# Patient Record
Sex: Male | Born: 1960 | Hispanic: No | Marital: Single | Smoking: Never smoker
Health system: Southern US, Community
[De-identification: ages and names within clinical notes are randomized; demographics above are authoritative.]

---

## 1998-03-27 ENCOUNTER — Emergency Department (HOSPITAL_COMMUNITY): Admission: EM | Admit: 1998-03-27 | Discharge: 1998-03-27 | Payer: Self-pay | Admitting: Emergency Medicine

## 1998-03-27 ENCOUNTER — Encounter: Payer: Self-pay | Admitting: Emergency Medicine

## 1998-03-29 ENCOUNTER — Emergency Department (HOSPITAL_COMMUNITY): Admission: EM | Admit: 1998-03-29 | Discharge: 1998-03-29 | Payer: Self-pay | Admitting: Emergency Medicine

## 1998-04-19 ENCOUNTER — Ambulatory Visit: Admission: RE | Admit: 1998-04-19 | Discharge: 1998-04-19 | Payer: Self-pay | Admitting: Family Medicine

## 1998-04-19 ENCOUNTER — Encounter: Payer: Self-pay | Admitting: Family Medicine

## 1999-02-05 ENCOUNTER — Emergency Department (HOSPITAL_COMMUNITY): Admission: EM | Admit: 1999-02-05 | Discharge: 1999-02-05 | Payer: Self-pay | Admitting: Emergency Medicine

## 1999-02-05 ENCOUNTER — Encounter: Payer: Self-pay | Admitting: Emergency Medicine

## 1999-02-12 ENCOUNTER — Emergency Department (HOSPITAL_COMMUNITY): Admission: EM | Admit: 1999-02-12 | Discharge: 1999-02-12 | Payer: Self-pay | Admitting: Emergency Medicine

## 2001-08-14 ENCOUNTER — Emergency Department (HOSPITAL_COMMUNITY): Admission: EM | Admit: 2001-08-14 | Discharge: 2001-08-14 | Payer: Self-pay | Admitting: Emergency Medicine

## 2001-08-14 ENCOUNTER — Encounter: Payer: Self-pay | Admitting: Emergency Medicine

## 2001-09-18 ENCOUNTER — Emergency Department (HOSPITAL_COMMUNITY): Admission: EM | Admit: 2001-09-18 | Discharge: 2001-09-18 | Payer: Self-pay | Admitting: Emergency Medicine

## 2001-09-18 ENCOUNTER — Encounter: Payer: Self-pay | Admitting: Emergency Medicine

## 2010-01-05 ENCOUNTER — Emergency Department (HOSPITAL_COMMUNITY): Admission: EM | Admit: 2010-01-05 | Discharge: 2010-01-05 | Payer: Self-pay | Admitting: Emergency Medicine

## 2016-05-24 LAB — GLUCOSE, POCT (MANUAL RESULT ENTRY): POC GLUCOSE: 111 mg/dL — AB (ref 70–99)

## 2017-07-01 ENCOUNTER — Ambulatory Visit: Payer: Medicaid Other | Admitting: Podiatry

## 2017-07-08 ENCOUNTER — Encounter: Payer: Self-pay | Admitting: Podiatry

## 2017-07-08 ENCOUNTER — Ambulatory Visit (INDEPENDENT_AMBULATORY_CARE_PROVIDER_SITE_OTHER): Payer: Medicaid Other | Admitting: Podiatry

## 2017-07-08 VITALS — BP 127/83 | HR 71 | Ht 68.0 in | Wt 210.0 lb

## 2017-07-08 DIAGNOSIS — B351 Tinea unguium: Secondary | ICD-10-CM | POA: Diagnosis not present

## 2017-07-08 DIAGNOSIS — B353 Tinea pedis: Secondary | ICD-10-CM

## 2017-07-08 DIAGNOSIS — Z79899 Other long term (current) drug therapy: Secondary | ICD-10-CM

## 2017-07-08 LAB — HEPATIC FUNCTION PANEL
AG RATIO: 1.5 (calc) (ref 1.0–2.5)
ALKALINE PHOSPHATASE (APISO): 73 U/L (ref 40–115)
ALT: 16 U/L (ref 9–46)
AST: 15 U/L (ref 10–35)
Albumin: 4.3 g/dL (ref 3.6–5.1)
BILIRUBIN DIRECT: 0.1 mg/dL (ref 0.0–0.2)
BILIRUBIN INDIRECT: 0.7 mg/dL (ref 0.2–1.2)
BILIRUBIN TOTAL: 0.8 mg/dL (ref 0.2–1.2)
Globulin: 2.8 g/dL (calc) (ref 1.9–3.7)
TOTAL PROTEIN: 7.1 g/dL (ref 6.1–8.1)

## 2017-07-08 LAB — CBC WITH DIFFERENTIAL/PLATELET
BASOS ABS: 9 {cells}/uL (ref 0–200)
Basophils Relative: 0.2 %
EOS PCT: 0.5 %
Eosinophils Absolute: 22 cells/uL (ref 15–500)
HCT: 41.1 % (ref 38.5–50.0)
HEMOGLOBIN: 14 g/dL (ref 13.2–17.1)
LYMPHS ABS: 1832 {cells}/uL (ref 850–3900)
MCH: 28.7 pg (ref 27.0–33.0)
MCHC: 34.1 g/dL (ref 32.0–36.0)
MCV: 84.4 fL (ref 80.0–100.0)
MPV: 10.5 fL (ref 7.5–12.5)
Monocytes Relative: 8.6 %
Neutro Abs: 2068 cells/uL (ref 1500–7800)
Neutrophils Relative %: 48.1 %
Platelets: 291 10*3/uL (ref 140–400)
RBC: 4.87 10*6/uL (ref 4.20–5.80)
RDW: 12.7 % (ref 11.0–15.0)
Total Lymphocyte: 42.6 %
WBC mixed population: 370 cells/uL (ref 200–950)
WBC: 4.3 10*3/uL (ref 3.8–10.8)

## 2017-07-08 MED ORDER — TERBINAFINE HCL 250 MG PO TABS: 250.0000 mg | ORAL_TABLET | Freq: Every day | ORAL | 0 refills | Status: DC

## 2017-07-08 NOTE — Progress Notes (Signed)
Subjective:    Patient ID: Jeffrey Velez, male    DOB: [birth year], 57 y.o.   MRN: [id number]  HPI  Chief Complaint  Patient presents with  . Tinea Pedis    bilateral/between toes painful, sometimes bleeds. Also complains of "smelly" feet and dry skin  57 year old male presents the office today for concerns of thick, discolored toenails as well as cracked skin in between her toes.  She also states that she has a smell to her feet at times.  She said no recent treatment for this.  He denies any open sores or skin fissures.  Denies any swelling.  She has no other concerns today.  Review of Systems  All other systems reviewed and are negative.  History reviewed. No pertinent past medical history.  History reviewed. No pertinent surgical history.   Current Outpatient Medications:  .  terbinafine (LAMISIL) 250 MG tablet, Take 1 tablet (250 mg total) by mouth daily., Disp: 90 tablet, Rfl: 0  No Known Allergies  Social History   Socioeconomic History  . Marital status: Single    Spouse name: Not on file  . Number of children: Not on file  . Years of education: Not on file  . Highest education level: Not on file  Social Needs  . Financial resource strain: Not on file  . Food insecurity - worry: Not on file  . Food insecurity - inability: Not on file  . Transportation needs - medical: Not on file  . Transportation needs - non-medical: Not on file  Occupational History  . Not on file  Tobacco Use  . Smoking status: Not on file  Substance and Sexual Activity  . Alcohol use: Not on file  . Drug use: Not on file  . Sexual activity: Not on file  Other Topics Concern  . Not on file  Social History Narrative  . Not on file       Objective:   Physical Exam  General: AAO x3, NAD  Dermatological: Nails appear to be hypertrophic, dystrophic with yellow-brown discoloration.  There is no surrounding redness or drainage there is no signs of infection.  No tenderness to  palpation of the nails.  Mild tinea pedis is present interdigitally as well as the plantar aspect the foot there is no skin fissures or drainage or open sores.  Vascular: Dorsalis Pedis artery and Posterior Tibial artery pedal pulses are 2/4 bilateral with immedate capillary fill time.  There is no pain with calf compression, swelling, warmth, erythema.   Neruologic: Grossly intact via light touch bilateral.  Protective threshold with Semmes Wienstein monofilament intact to all pedal sites bilateral.   Musculoskeletal: No gross boney pedal deformities bilateral. No pain, crepitus, or limitation noted with foot and ankle range of motion bilateral. Muscular strength 5/5 in all groups tested bilateral.  Gait: Unassisted, Nonantalgic.      Assessment & Plan:  57 year old male with onychomycosis, tinea pedis -Treatment options discussed including all alternatives, risks, and complications -Etiology of symptoms were discussed -Discussed various treatment options.  As we discussed them she elects to proceed with oral Lamisil.  We will check a CBC and LFT prior to starting this and she is not to start the medication until I call her with the results of the blood work and she verbally understood this. -We also discussed external factors to help with the sweating including changes socks and shoes as well as cleaning of the shoes.  Also discussed diluted vinegar soaks.  Also because  of the shower is clean. -Follow-up in 6 weeks or sooner if needed.  Call any questions or concerns meantime.  She agrees this plan has no further questions or concerns  Vivi BarrackMatthew R Wagoner DPM

## 2017-07-08 NOTE — Patient Instructions (Signed)

## 2017-07-09 ENCOUNTER — Telehealth: Payer: Self-pay | Admitting: *Deleted

## 2017-07-09 MED ORDER — TERBINAFINE HCL 250 MG PO TABS: 250.0000 mg | ORAL_TABLET | Freq: Every day | ORAL | 0 refills | Status: AC

## 2017-07-09 MED ORDER — TERBINAFINE HCL 250 MG PO TABS: 250.0000 mg | ORAL_TABLET | Freq: Every day | ORAL | 0 refills | Status: DC

## 2017-07-09 NOTE — Telephone Encounter (Signed)
Pt states [facility] rx [facility] at [facility]

## 2017-07-09 NOTE — Telephone Encounter (Signed)
I spoke with pt and he states the [facility] at the [facility] on [address]. I reviewed orders and Med & Orders showed pt was to receive Terbinafine. I put in pt's requested [facility] [phone] and ordered the terbinafine. Pt asked if he could go ahead and take the medication. I checked pt's labs and Dr. Ardelle AntonWagoner had reviewed results and ordered pt to take the medication as ordered. I informed pt.

## 2017-07-09 NOTE — Telephone Encounter (Signed)
-----   Message from Vivi BarrackMatthew R Wagoner, DPM sent at 07/09/2017  8:42 AM EST ----- Lab work is normal. Can start Lamisil. Please let him know.

## 2017-08-04 DIAGNOSIS — J342 Deviated nasal septum: Secondary | ICD-10-CM | POA: Insufficient documentation

## 2017-08-04 DIAGNOSIS — R0683 Snoring: Secondary | ICD-10-CM | POA: Insufficient documentation

## 2017-08-06 ENCOUNTER — Telehealth: Payer: Self-pay | Admitting: *Deleted

## 2017-08-06 NOTE — Telephone Encounter (Signed)
I informed pt Jeffrey Velez - [facility] stated he could pick up the remainder of the terbinafine rx.

## 2017-08-06 NOTE — Telephone Encounter (Addendum)
I spoke with Horace PorteousXena - [facility] [phone] she states 07/10/2017 #30 was given to pt as a partial fill of the prescription, and he can come to the pharmacy for the remainder of the terbinafine prescription.

## 2017-08-06 NOTE — Telephone Encounter (Signed)
Pt states he would like a refill of his medication.

## 2017-08-06 NOTE — Telephone Encounter (Signed)
I told pt Dr. Ardelle AntonWagoner prescribed #90 which is the therapeutic dosing for fungal toenail treatment and that he would need another appt at the 6 week time limit, and also that it would take about 6-9 months to see healthy outgrow of the toenails. Pt states he needs a refill his pharmacy has only given him #30 of the terbinafine.

## 2017-10-09 ENCOUNTER — Ambulatory Visit (INDEPENDENT_AMBULATORY_CARE_PROVIDER_SITE_OTHER): Payer: Medicaid Other | Admitting: Podiatry

## 2017-10-09 DIAGNOSIS — Z79899 Other long term (current) drug therapy: Secondary | ICD-10-CM

## 2017-10-09 DIAGNOSIS — B351 Tinea unguium: Secondary | ICD-10-CM

## 2017-10-09 DIAGNOSIS — L988 Other specified disorders of the skin and subcutaneous tissue: Secondary | ICD-10-CM

## 2017-10-09 LAB — CBC WITH DIFFERENTIAL/PLATELET
Basophils Absolute: 8 cells/uL (ref 0–200)
Basophils Relative: 0.2 %
EOS PCT: 1 %
Eosinophils Absolute: 42 cells/uL (ref 15–500)
HEMATOCRIT: 39 % (ref 38.5–50.0)
HEMOGLOBIN: 13.8 g/dL (ref 13.2–17.1)
LYMPHS ABS: 1533 {cells}/uL (ref 850–3900)
MCH: 29.7 pg (ref 27.0–33.0)
MCHC: 35.4 g/dL (ref 32.0–36.0)
MCV: 83.9 fL (ref 80.0–100.0)
MPV: 10.1 fL (ref 7.5–12.5)
Monocytes Relative: 9.9 %
NEUTROS ABS: 2201 {cells}/uL (ref 1500–7800)
Neutrophils Relative %: 52.4 %
Platelets: 284 10*3/uL (ref 140–400)
RBC: 4.65 10*6/uL (ref 4.20–5.80)
RDW: 12.7 % (ref 11.0–15.0)
Total Lymphocyte: 36.5 %
WBC mixed population: 416 cells/uL (ref 200–950)
WBC: 4.2 10*3/uL (ref 3.8–10.8)

## 2017-10-09 LAB — HEPATIC FUNCTION PANEL
AG Ratio: 1.6 (calc) (ref 1.0–2.5)
ALBUMIN MSPROF: 4.2 g/dL (ref 3.6–5.1)
ALT: 11 U/L (ref 9–46)
AST: 13 U/L (ref 10–35)
Alkaline phosphatase (APISO): 68 U/L (ref 40–115)
BILIRUBIN TOTAL: 0.6 mg/dL (ref 0.2–1.2)
Bilirubin, Direct: 0.1 mg/dL (ref 0.0–0.2)
Globulin: 2.6 g/dL (calc) (ref 1.9–3.7)
Indirect Bilirubin: 0.5 mg/dL (calc) (ref 0.2–1.2)
Total Protein: 6.8 g/dL (ref 6.1–8.1)

## 2017-10-09 MED ORDER — CASTELLANI PAINT 1.5 % EX LIQD: 1.0000 "application " | Freq: Every day | CUTANEOUS | 0 refills | Status: AC

## 2017-10-09 NOTE — Progress Notes (Signed)
Subjective: 57 year old male presents the office for follow-up evaluation of athlete's foot as well as nail fungus.  He says he placed the Lamisil yesterday.  He has noticed improvement in the nails but he states that he still has some problems between his toes with moisture.  Is asking Castellani's paint on him again today is asking if this can also be prescribed for him.  He had no complications with Lamisil no problems taking the medication.  He has no pain in the nails today and denies any changes or any new concerns. Denies any systemic complaints such as fevers, chills, nausea, vomiting. No acute changes since last appointment, and no other complaints at this time.   Objective: AAO x3, NAD DP/PT pulses palpable bilaterally, CRT less than 3 seconds Overall the toenail still appear to be hypertrophic, dystrophic with yellow to brown discoloration however there is clearing on the proximal aspect of the nail mostly on bilateral hallux toenails.  There is no pain to the nail there is no surrounding redness or drainage.  There is interdigital maceration present.  No open lesions or pre-ulcerative lesions.  No pain with calf compression, swelling, warmth, erythema  Assessment: Onychomycosis, interdigital maceration  Plan: -All treatment options discussed with the patient including all alternatives, risks, complications.  -At this point he is finished his Lamisil yesterday.  I do think that we should continue Lamisil for 1 more month given that is improving although it looks like it is are starting to improve.  We will check a CBC and LFT.  This comes back normal we will order 30 days of Lamisil.  Continue to monitor any side effects. -Castellani's paint was applied interdigitally today.  Also discussed trying between his toes.  Prescribed Castellani's paint for him instructed on use. -Patient encouraged to call the office with any questions, concerns, change in symptoms.   Vivi BarrackMatthew R Stephana Morell DPM

## 2017-10-09 NOTE — Patient Instructions (Signed)

## 2017-10-10 ENCOUNTER — Telehealth: Payer: Self-pay | Admitting: Podiatry

## 2017-10-10 NOTE — Telephone Encounter (Signed)
Yes ma'am, my name is Orie Lorincz. Please give me a call back at [phone]. Thank you.

## 2017-10-13 ENCOUNTER — Telehealth: Payer: Self-pay | Admitting: Podiatry

## 2017-10-13 MED ORDER — TERBINAFINE HCL 250 MG PO TABS: 250.0000 mg | ORAL_TABLET | Freq: Every day | ORAL | 0 refills | Status: AC

## 2017-10-13 NOTE — Telephone Encounter (Signed)
I spoke with pt and he states Dr. Ardelle Anton said after he had his labs he would write him for another month of terbinafine. I review the last clinicals and DR. Wagoner reviewed the results and stated pt could continue #30 terbinafine as previously ordered. Orders to the [facility] [address].

## 2017-10-13 NOTE — Telephone Encounter (Signed)
This is Jeffrey Velez. Please call me back at [phone]. Thank you.

## 2017-10-13 NOTE — Addendum Note (Signed)
Addended by: Alphia Kava D on: 10/13/2017 10:54 AM   Modules accepted: Orders

## 2017-10-13 NOTE — Telephone Encounter (Signed)
Pt contact with results and terbinafine #30 ordered on previous Telephone Message.

## 2017-10-13 NOTE — Telephone Encounter (Signed)
Patient called an stated that he came in April 25,2019 for a refill on his Lamisil Medication. He was wondering could the refill be sent over to his pharmacy.

## 2018-02-24 ENCOUNTER — Ambulatory Visit: Payer: Medicaid Other | Admitting: Podiatry

## 2018-02-24 ENCOUNTER — Telehealth: Payer: Self-pay | Admitting: *Deleted

## 2018-02-24 ENCOUNTER — Encounter: Payer: Self-pay | Admitting: Podiatry

## 2018-02-24 DIAGNOSIS — M79661 Pain in right lower leg: Secondary | ICD-10-CM

## 2018-02-24 DIAGNOSIS — M79662 Pain in left lower leg: Secondary | ICD-10-CM

## 2018-02-24 DIAGNOSIS — L988 Other specified disorders of the skin and subcutaneous tissue: Secondary | ICD-10-CM

## 2018-02-24 DIAGNOSIS — B351 Tinea unguium: Secondary | ICD-10-CM | POA: Diagnosis not present

## 2018-02-24 DIAGNOSIS — B353 Tinea pedis: Secondary | ICD-10-CM

## 2018-02-24 DIAGNOSIS — L853 Xerosis cutis: Secondary | ICD-10-CM

## 2018-02-24 DIAGNOSIS — R609 Edema, unspecified: Secondary | ICD-10-CM

## 2018-02-24 MED ORDER — AMMONIUM LACTATE 12 % EX CREA: TOPICAL_CREAM | CUTANEOUS | 0 refills | Status: AC | PRN

## 2018-02-24 NOTE — Telephone Encounter (Signed)
Orders faxed to [facility]. 

## 2018-02-24 NOTE — Telephone Encounter (Signed)
-----   Message from Vivi Barrack, DPM sent at 02/24/2018  2:30 PM EDT ----- Can you please order a venous reflux study to rule out DVT? He has chronic leg pain.

## 2018-02-25 NOTE — Progress Notes (Signed)
Subjective: 57 year old male presents the office today for follow-up evaluation after completing Lamisil.  He states that he is finished the medicine without any side effects.  He states he has some dry skin lipomas.  The itching has much improved.  Also he feels the toenails have improved although still somewhat discolored and thick with no pain in the nails no redness or drainage or any swelling.  He is been trying to dry thoroughly between his toes.  He did not get the [place] paint because of cost. Denies any systemic complaints such as fevers, chills, nausea, vomiting. No acute changes since last appointment, and no other complaints at this time.   Objective: AAO x3, NAD DP/PT pulses palpable bilaterally, CRT less than 3 seconds Dry skin present to the plantar aspect of bilateral feet but there is no significant tinea pedis present.  Mild interdigital maceration of the left fourth interspace into the upper interspace in the left foot but otherwise doing well on the right side.  There is no drainage or pus or any redness or signs of infection.  The nails appear to be somewhat improved however still somewhat dystrophic, discolored but there is no pain in the nails no surrounding redness or drainage or any signs of infection. No open lesions or pre-ulcerative lesions.  No pain with calf compression, swelling, warmth, erythema  Assessment: Dry skin with improving tinea pedis, onychomycosis with interdigital maceration  Plan: -All treatment options discussed with the patient including all alternatives, risks, complications.  -Prescribed AmLactin cream for the dry skin.  Discussed drying thoroughly to his toes.  Given the cost he did not receive the Castellani's paint.  Discussed Betadine, small amount interdigitally.  Also discussed topical options to allow the nails to continue to grow out. -Patient encouraged to call the office with any questions, concerns, change in symptoms.   Vivi Barrack DPM

## 2018-02-26 NOTE — Telephone Encounter (Signed)
[place] - MEDICAID NOTIFICATION STATES, CASE APPROVED, AUTHORIZATION NUMBER:  [id number], EFFECTIVE 02/26/2018, END DATE 03/28/2018. Faxed to [facility].

## 2018-03-09 ENCOUNTER — Ambulatory Visit (HOSPITAL_COMMUNITY)
Admission: RE | Admit: 2018-03-09 | Discharge: 2018-03-09 | Disposition: A | Discharge status: Home / Self Care | Payer: Medicaid Other | Source: Ambulatory Visit | Attending: Cardiovascular Disease | Admitting: Cardiovascular Disease

## 2018-03-09 ENCOUNTER — Other Ambulatory Visit: Payer: Self-pay | Admitting: Podiatry

## 2018-03-09 DIAGNOSIS — M79661 Pain in right lower leg: Secondary | ICD-10-CM | POA: Insufficient documentation

## 2018-03-09 DIAGNOSIS — M79662 Pain in left lower leg: Secondary | ICD-10-CM | POA: Diagnosis present

## 2018-03-09 DIAGNOSIS — R609 Edema, unspecified: Secondary | ICD-10-CM | POA: Insufficient documentation

## 2018-03-17 ENCOUNTER — Telehealth: Payer: Self-pay | Admitting: *Deleted

## 2018-03-17 NOTE — Telephone Encounter (Signed)
Called and spoke with the patient and stated that there was no evidence of a DVT and stated that if the patient had any further questions or concerns to call the [facility] office at [phone]. Misty Stanley

## 2020-05-09 ENCOUNTER — Other Ambulatory Visit: Payer: Self-pay

## 2020-05-09 ENCOUNTER — Ambulatory Visit (HOSPITAL_COMMUNITY)
Admission: EM | Admit: 2020-05-09 | Discharge: 2020-05-09 | Discharge status: Absent without leave | Payer: Medicaid Other

## 2020-11-13 ENCOUNTER — Other Ambulatory Visit: Payer: Self-pay

## 2020-11-13 ENCOUNTER — Emergency Department (HOSPITAL_BASED_OUTPATIENT_CLINIC_OR_DEPARTMENT_OTHER): Payer: Medicaid Other

## 2020-11-13 ENCOUNTER — Emergency Department (HOSPITAL_BASED_OUTPATIENT_CLINIC_OR_DEPARTMENT_OTHER)
Admission: EM | Admit: 2020-11-13 | Discharge: 2020-11-13 | Disposition: A | Discharge status: Home / Self Care | Payer: Medicaid Other | Attending: Emergency Medicine | Admitting: Emergency Medicine

## 2020-11-13 ENCOUNTER — Encounter (HOSPITAL_BASED_OUTPATIENT_CLINIC_OR_DEPARTMENT_OTHER): Payer: Self-pay | Admitting: *Deleted

## 2020-11-13 DIAGNOSIS — M79662 Pain in left lower leg: Secondary | ICD-10-CM | POA: Insufficient documentation

## 2020-11-13 DIAGNOSIS — R252 Cramp and spasm: Secondary | ICD-10-CM

## 2020-11-13 DIAGNOSIS — R350 Frequency of micturition: Secondary | ICD-10-CM | POA: Insufficient documentation

## 2020-11-13 LAB — CBC WITH DIFFERENTIAL/PLATELET
Abs Immature Granulocytes: 0.14 10*3/uL — ABNORMAL HIGH (ref 0.00–0.07)
Basophils Absolute: 0 10*3/uL (ref 0.0–0.1)
Basophils Relative: 1 %
Eosinophils Absolute: 0 10*3/uL (ref 0.0–0.5)
Eosinophils Relative: 1 %
HCT: 37.9 % — ABNORMAL LOW (ref 39.0–52.0)
Hemoglobin: 12.7 g/dL — ABNORMAL LOW (ref 13.0–17.0)
Immature Granulocytes: 4 %
Lymphocytes Relative: 41 %
Lymphs Abs: 1.6 10*3/uL (ref 0.7–4.0)
MCH: 29.1 pg (ref 26.0–34.0)
MCHC: 33.5 g/dL (ref 30.0–36.0)
MCV: 86.9 fL (ref 80.0–100.0)
Monocytes Absolute: 0.5 10*3/uL (ref 0.1–1.0)
Monocytes Relative: 12 %
Neutro Abs: 1.6 10*3/uL — ABNORMAL LOW (ref 1.7–7.7)
Neutrophils Relative %: 41 %
Platelets: 255 10*3/uL (ref 150–400)
RBC: 4.36 MIL/uL (ref 4.22–5.81)
RDW: 12.6 % (ref 11.5–15.5)
WBC: 3.9 10*3/uL — ABNORMAL LOW (ref 4.0–10.5)
nRBC: 0 % (ref 0.0–0.2)

## 2020-11-13 LAB — MAGNESIUM: Magnesium: 2 mg/dL (ref 1.7–2.4)

## 2020-11-13 LAB — BASIC METABOLIC PANEL
Anion gap: 7 (ref 5–15)
BUN: 9 mg/dL (ref 8–23)
CO2: 30 mmol/L (ref 22–32)
Calcium: 8.4 mg/dL — ABNORMAL LOW (ref 8.9–10.3)
Chloride: 101 mmol/L (ref 98–111)
Creatinine, Ser: 0.77 mg/dL (ref 0.61–1.24)
GFR, Estimated: >60 mL/min (ref >60)
Glucose, Bld: 99 mg/dL (ref 70–99)
Potassium: 3.8 mmol/L (ref 3.5–5.1)
Sodium: 138 mmol/L (ref 135–145)

## 2020-11-13 LAB — CBG MONITORING, ED: Glucose-Capillary: 126 mg/dL — ABNORMAL HIGH (ref 70–99)

## 2020-11-13 LAB — URINALYSIS, ROUTINE W REFLEX MICROSCOPIC
Bilirubin Urine: NEGATIVE
Glucose, UA: NEGATIVE mg/dL
Ketones, ur: NEGATIVE mg/dL
Leukocytes,Ua: NEGATIVE
Nitrite: NEGATIVE
Protein, ur: NEGATIVE mg/dL
Specific Gravity, Urine: 1.016 (ref 1.005–1.030)
pH: 5.5 (ref 5.0–8.0)

## 2020-11-13 MED ORDER — DICLOFENAC SODIUM 3 % EX GEL: 2.0000 g | Freq: Three times a day (TID) | CUTANEOUS | 0 refills | Status: AC | PRN

## 2020-11-13 NOTE — ED Notes (Signed)
States he has been using the restroom more frequently, denies hx of diabetes, denies having increase in thirst, ED MD at bedside, orders rec

## 2020-11-13 NOTE — ED Notes (Signed)
Patient transported to Ultrasound 

## 2020-11-13 NOTE — ED Notes (Signed)
Has strong plantar and dorsal flexion, full sensation in both feet

## 2020-11-13 NOTE — ED Provider Notes (Signed)
[facility] EMERGENCY DEPT Provider Note   CSN: [id number] Arrival date & time: 11/13/20  1409     History Chief Complaint  Patient presents with  . Leg cramps    Jeffrey Velez is a 60 y.o. male.  The history is provided by the patient and medical records.   Jeffrey Velez is a 60 y.o. male who presents to the Emergency Department complaining of leg cramps. He presents the emergency department complaining of cramping to the left posterior calf at that started a few days ago. He also reports increased urinary frequency over the last few days. He denies any fevers, chest pain, shortness of breath, nausea, vomiting, abdominal pain, increase in thirst. He has no known medical problems and takes no medications. 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    No past medical history on file.  Patient Active Problem List   Diagnosis Date Noted  . Deviated nasal septum 08/04/2017  . Snoring 08/04/2017    No past surgical history on file.     No family history on file.  Social History   Tobacco Use  . Smoking status: Never Smoker  . Smokeless tobacco: Never Used  Substance Use Topics  . Alcohol use: Never  . Drug use: Never    Home Medications Prior to Admission medications   Medication Sig Start Date End Date Taking? Authorizing Provider  Diclofenac Sodium 3 % GEL Apply 2 g topically 3 (three) times daily as needed. 11/13/20  Yes Tilden Fossa, MD  ammonium lactate (AMLACTIN) 12 % cream Apply topically as needed for dry skin. 02/24/18   Vivi Barrack, DPM  Castellani Paint 1.5 % LIQD Apply 1 application topically daily. 10/09/17   Vivi Barrack, DPM  terbinafine (LAMISIL) 250 MG tablet Take 1 tablet (250 mg total) by mouth daily. 07/09/17   Vivi Barrack, DPM  terbinafine (LAMISIL) 250 MG tablet Take 1 tablet (250 mg total) by mouth daily. 10/13/17   Vivi Barrack, DPM    Allergies    Patient has no known  allergies.  Review of Systems   Review of Systems  All other systems reviewed and are negative.   Physical Exam Updated Vital Signs BP 133/74 (BP Location: Right Arm)   Pulse 77   Temp 98.2 F (36.8 C)   Resp 18   Ht 5\' 8"  (1.727 m)   Wt 79.8 kg   SpO2 99%   BMI 26.76 kg/m   Physical Exam Vitals and nursing note reviewed.  Constitutional:      Appearance: He is well-developed.  HENT:     Head: Normocephalic and atraumatic.  Cardiovascular:     Rate and Rhythm: Normal rate and regular rhythm.  Pulmonary:     Effort: Pulmonary effort is normal. No respiratory distress.  Abdominal:     Palpations: Abdomen is soft.     Tenderness: There is no abdominal tenderness. There is no guarding or rebound.  Musculoskeletal:     Comments: 2+ DP pulses bilaterally. There is mild tenderness to palpation on the posterior calf without overlying erythema.  Skin:    General: Skin is warm and dry.  Neurological:     Mental Status: He is alert and oriented to person, place, and time.  Psychiatric:        Behavior: Behavior normal.     ED Results / Procedures / Treatments   Labs (all labs ordered are listed, but only abnormal  results are displayed) Labs Reviewed  BASIC METABOLIC PANEL - Abnormal; Notable for the following components:      Result Value   Calcium 8.4 (*)    All other components within normal limits  CBC WITH DIFFERENTIAL/PLATELET - Abnormal; Notable for the following components:   WBC 3.9 (*)    Hemoglobin 12.7 (*)    HCT 37.9 (*)    Neutro Abs 1.6 (*)    Abs Immature Granulocytes 0.14 (*)    All other components within normal limits  CBG MONITORING, ED - Abnormal; Notable for the following components:   Glucose-Capillary 126 (*)    All other components within normal limits  MAGNESIUM    EKG None  Radiology US Venous Img Lower  Left (DVT Study)  Result Date: 11/13/2020 CLINICAL DATA:  60 year old male with left lower extremity cramping. EXAM: Left LOWER  EXTREMITY VENOUS DOPPLER ULTRASOUND TECHNIQUE: Gray-scale sonography with compression, as well as color and duplex ultrasound, were performed to evaluate the deep venous system(s) from the level of the common femoral vein through the popliteal and proximal calf veins. COMPARISON:  None. FINDINGS: VENOUS Normal compressibility of the common femoral, superficial femoral, and popliteal veins, as well as the visualized calf veins. Visualized portions of profunda femoral vein and great saphenous vein unremarkable. No filling defects to suggest DVT on grayscale or color Doppler imaging. Doppler waveforms show normal direction of venous flow, normal respiratory plasticity and response to augmentation. Limited views of the contralateral common femoral vein are unremarkable. OTHER None. Limitations: none IMPRESSION: Negative. Electronically Signed   By: Elgie Collard M.D.   On: 11/13/2020 15:16    Procedures Procedures   Medications Ordered in ED Medications - No data to display  ED Course  I have reviewed the triage vital signs and the nursing notes.  Pertinent labs & imaging results that were available during my care of the patient were reviewed by me and considered in my medical decision making (see chart for details).    MDM Rules/Calculators/A&P                         patient here for evaluation of left calf pain as well as urinary frequency. He is non-toxic appearing on evaluation. No evidence of acute cellulitis or abscess. Leg is well perfused. Presentation is not consistent with dissection. Vascular ultrasound is negative for DVT. Labs with no significant electrolyte abnormality. Patient care transferred pending UA to evaluate for UTI.  Final Clinical Impression(s) / ED Diagnoses Final diagnoses:  Leg cramping    Rx / DC Orders ED Discharge Orders         Ordered    Diclofenac Sodium 3 % GEL  3 times daily PRN        11/13/20 1529           Tilden Fossa, MD 11/13/20 1533

## 2020-11-13 NOTE — ED Notes (Signed)
(  pt ready for DC, MD ordered urine spec prior to DC to home, to await results)

## 2020-11-13 NOTE — ED Triage Notes (Signed)
Presents today with leg cramps, onset two days ago. Denies any N/V/D. Sitting makes legs cramp worse, primarily in left leg. 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

## 2021-06-17 ENCOUNTER — Emergency Department (HOSPITAL_BASED_OUTPATIENT_CLINIC_OR_DEPARTMENT_OTHER)
Admission: EM | Admit: 2021-06-17 | Discharge: 2021-06-17 | Disposition: A | Discharge status: Home / Self Care | Payer: Medicaid Other | Attending: Emergency Medicine | Admitting: Emergency Medicine

## 2021-06-17 ENCOUNTER — Encounter (HOSPITAL_BASED_OUTPATIENT_CLINIC_OR_DEPARTMENT_OTHER): Payer: Self-pay

## 2021-06-17 ENCOUNTER — Other Ambulatory Visit: Payer: Self-pay

## 2021-06-17 DIAGNOSIS — X58XXXA Exposure to other specified factors, initial encounter: Secondary | ICD-10-CM | POA: Diagnosis not present

## 2021-06-17 DIAGNOSIS — S34109A Unspecified injury to unspecified level of lumbar spinal cord, initial encounter: Secondary | ICD-10-CM | POA: Diagnosis present

## 2021-06-17 DIAGNOSIS — M799 Soft tissue disorder, unspecified: Secondary | ICD-10-CM | POA: Insufficient documentation

## 2021-06-17 DIAGNOSIS — S39012A Strain of muscle, fascia and tendon of lower back, initial encounter: Secondary | ICD-10-CM | POA: Diagnosis not present

## 2021-06-17 DIAGNOSIS — M7989 Other specified soft tissue disorders: Secondary | ICD-10-CM

## 2021-06-17 DIAGNOSIS — M79672 Pain in left foot: Secondary | ICD-10-CM

## 2021-06-17 DIAGNOSIS — B353 Tinea pedis: Secondary | ICD-10-CM | POA: Insufficient documentation

## 2021-06-17 MED ORDER — TRAMADOL HCL 50 MG PO TABS: ORAL_TABLET | ORAL | 0 refills | Status: AC

## 2021-06-17 MED ORDER — TOLNAFTATE 1 % EX CREA: 1.0000 "application " | TOPICAL_CREAM | Freq: Two times a day (BID) | CUTANEOUS | 0 refills | Status: AC

## 2021-06-17 NOTE — ED Triage Notes (Signed)
Pt presents with multiple complaints.   1) Pt c/o painful bump to L periorbital area x 2 week.   2) Pt c/o L lumbar pain after working on his car x 5 days.   3) Pt c/o L foot pain x 5 days.

## 2021-06-17 NOTE — ED Provider Notes (Signed)
[facility]   CSN: [id number] Arrival date & time: 06/17/21  1106     History  Chief Complaint  Patient presents with   Eye Pain   Back Pain   Foot Pain    Jeffrey Velez is a 61 y.o. male.  HPI Patient has 3 apparent separate complaints.  First complaint is regarding a nodular mass next to his lower lid of the left eye.  Patient reports is not painful.  It does not interfere with his vision or cause any drainage or discharge.  No injury that he is aware of.  He reports its been there for weeks or more.  He is never really been able to see what it looks like.  He can feel a knot when he touches the area.  He has not had any consultation with another specialist or tried any kind of treatment or drops.  Second complaint is left lower back pain.  Patient indicates he has pain in his lumbar back off the midline.  He reports it is aching and stiff.  Patient reports he thinks it happened because about a week ago he had to lie down and stretch in the floor of his car to reach diffuse box under the steering wheel.  He denies there is radiation of pain.  It does not hurt into his buttock abdomen or legs.  He denies he has tried anything to treat the pain.  Patient ports he also has pain on the outside of his left foot.  He is not too specific as to how long this is been the case no injury that he is aware of.  No redness or swelling.  On exam patient does have some mild tinea pedis.  He reports he occasionally treats that.  He denies that the pain on the side of his foot seems to be radiating from his back.  He feels that is a separate problem.  He denies he has significant mount of pain with ambulating but does make it slightly worse.    Home Medications Prior to Admission medications   Medication Sig Start Date End Date Taking? Authorizing Provider  tolnaftate (TINACTIN) 1 % cream Apply 1 application topically 2 (two) times daily. Placed between  toes after cleaning and drying feet well. 06/17/21  Yes Arby BarrettePfeiffer, Johnothan Bascomb, MD  traMADol (ULTRAM) 50 MG tablet 1-2 tablets every 6 hours as needed for pain 06/17/21  Yes Lorrin Nawrot, Lebron ConnersMarcy, MD  ammonium lactate (AMLACTIN) 12 % cream Apply topically as needed for dry skin. 02/24/18   Vivi BarrackWagoner, Matthew R, DPM  Castellani Paint 1.5 % LIQD Apply 1 application topically daily. 10/09/17   Vivi BarrackWagoner, Matthew R, DPM  Diclofenac Sodium 3 % GEL Apply 2 g topically 3 (three) times daily as needed. 11/13/20   Tilden Fossaees, Elizabeth, MD  terbinafine (LAMISIL) 250 MG tablet Take 1 tablet (250 mg total) by mouth daily. 07/09/17   Vivi BarrackWagoner, Matthew R, DPM  terbinafine (LAMISIL) 250 MG tablet Take 1 tablet (250 mg total) by mouth daily. 10/13/17   Vivi BarrackWagoner, Matthew R, DPM      Allergies    Patient has no known allergies.    Review of Systems   Review of Systems 10 systems reviewed negative except as per HPI Physical Exam Updated Vital Signs BP (!) 123/94 (BP Location: Right Arm)    Pulse (!) 59    Temp 98.3 F (36.8 C) (Oral)    Resp 16    Ht 5\' 8"  (1.727 m)  Wt 115.1 kg    SpO2 100%    BMI 38.58 kg/m  Physical Exam Constitutional:      Comments: Patient is alert nontoxic well-nourished well-developed.  No respiratory distress.  HENT:     Head: Normocephalic and atraumatic.     Mouth/Throat:     Pharynx: Oropharynx is clear.  Eyes:     Comments: Patient has a firm, nodular soft tissue lesion in the outer margin of the lower lid region.  This does not actually appear to be inclusive of the lash line or mucosa.  No periorbital swelling, erythema or sign of cellulitis.  The nodule is firm and nontender.  It is mobile beneath the skin.  See the attached images.  Eye otherwise normal without injection and normal range of motion.  Cardiovascular:     Rate and Rhythm: Normal rate and regular rhythm.  Pulmonary:     Effort: Pulmonary effort is normal.     Breath sounds: Normal breath sounds.  Abdominal:     General: There is no  distension.     Palpations: Abdomen is soft.     Tenderness: There is no abdominal tenderness.  Musculoskeletal:        General: No swelling or tenderness. Normal range of motion.     Cervical back: Neck supple.     Comments: Patient indicates area of discomfort in his lumbar back above the SI joint and lateral to the spine.  He reports its aching tight feeling.  He has good range of motion.  He can go from supine to sitting to standing without difficulty.  No focally reproducible pain.  No bony point tenderness and no objective soft tissue abnormality on exam.  Examination of the left foot is grossly normal.  There is no significant swelling or erythema.  No effusion at the joint.  Patient does have mild tinea pedis between the toes.  His toes approximate very tightly.  No maceration and no appearance of secondary cellulitis.  Patient is not tender to deep pressure on the sole of the foot.  He does also have some onychomycosis of the great nails.  Skin:    General: Skin is warm and dry.  Neurological:     General: No focal deficit present.     Mental Status: He is oriented to person, place, and time.     Cranial Nerves: No cranial nerve deficit.     Coordination: Coordination normal.     ED Results / Procedures / Treatments   Labs (all labs ordered are listed, but only abnormal results are displayed) Labs Reviewed - No data to display  EKG None  Radiology No results found.  Procedures Procedures    Medications Ordered in ED Medications - No data to display  ED Course/ Medical Decision Making/ A&P                           Medical Decision Making I have extensively reviewed a treatment plan for the patient.  Regarding the soft tissue mass adjacent to his left eye.  This appears chronic and scarred.  It is nontender and there is no fluctuance or feeling of abscess development.  Unclear if it is a scarred lacrimal duct.  It does not appear to involve the actual lid or mucosa.   It is stable and nontender.  Patient is counseled to follow-up with ophthalmology for further evaluation of the soft tissue mass within the close approximation of the  eye.  This is a nonemergent finding today.  Regarding patient's low back pain.  Findings are consistent with an acute lumbar strain given patient's recent activities.  He denies any radicular symptoms.  He is otherwise clinically well.  He has normal neurovascular function.  Plan will be for pain control with acetaminophen and tramadol and recommended Lidoderm patches with follow-up with a PCP.  No objective findings on examination of the left foot.  After reviewing the history this does not sound like radicular pain.  Patient not significantly tender to palpation and is ambulating without difficulty.  Only finding is very close approximation of the toes with some moderate amount of tinea pedis between the toes but not macerated, moist or secondarily infected.  I recommended foot care and Tinactin. Patient made aware of necessity to get a primary care physician locally.  Resource guide has been provided.  Final Clinical Impression(s) / ED Diagnoses Final diagnoses:  Soft tissue mass  Low back strain, initial encounter  Left foot pain  Tinea pedis of left foot    Rx / DC Orders ED Discharge Orders          Ordered    tolnaftate (TINACTIN) 1 % cream  2 times daily        06/17/21 1326    traMADol (ULTRAM) 50 MG tablet        06/17/21 1326              Arby Barrette, MD 06/17/21 1346

## 2021-06-17 NOTE — Discharge Instructions (Addendum)
1.  You have pulled your back.  For pain you may take extra strength Tylenol 2 tablets every 6 hours.  You may buy this at the drugstore.  Also take tramadol 1 to 2 tablets every 6 hours with Tylenol as prescribed if needed for additional pain control.  Try to continue stretching your back and doing light exercise to minimize the amount of stiffness. 2.  You may also buy an over-the-counter pain patch such as Lidoderm or IcyHot to put on your lower back. 3.  You have athlete's foot between the toes on your foot.  Be sure to clean your feet well and dry between the toes.  Then apply the athlete's foot cream twice daily as prescribed. 4.  The lump next to your left eye is not damaging the eye or your vision.  For further treatment of this you will need to see an eye specialist called an ophthalmologist.  They may do a surgical treatment if needed.  The name of an ophthalmologist is included your discharge instructions that you may call. Dr. Fredderick Phenix 5.  A list of local clinics and doctors have been included in your discharge instructions.  You should try to find a family doctor to help you with your basic medical care.  Use this list to find someone and set an appointment within the next 1 to 2 weeks if possible.

## 2021-07-03 ENCOUNTER — Encounter: Payer: Self-pay | Admitting: Emergency Medicine

## 2021-07-03 ENCOUNTER — Other Ambulatory Visit: Payer: Self-pay

## 2021-07-03 ENCOUNTER — Ambulatory Visit
Admission: EM | Admit: 2021-07-03 | Discharge: 2021-07-03 | Disposition: A | Discharge status: Home / Self Care | Payer: Medicaid Other

## 2021-07-03 DIAGNOSIS — L989 Disorder of the skin and subcutaneous tissue, unspecified: Secondary | ICD-10-CM

## 2021-07-03 NOTE — ED Provider Notes (Signed)
[facility]    CSN: [id number] Arrival date & time: 07/03/21  0949      History   Chief Complaint Chief Complaint  Patient presents with   Cyst    HPI Jeffrey Velez is a 61 y.o. male.   Here today for evaluation of cystlike lesion below his left eye that he noticed a few weeks ago.  He states that cyst has not grown in size and is not painful.  The history is provided by the patient.   History reviewed. No pertinent past medical history.  Patient Active Problem List   Diagnosis Date Noted   Deviated nasal septum 08/04/2017   Snoring 08/04/2017    History reviewed. No pertinent surgical history.     Home Medications    Prior to Admission medications   Medication Sig Start Date End Date Taking? Authorizing Provider  ammonium lactate (AMLACTIN) 12 % cream Apply topically as needed for dry skin. 02/24/18   Trula Slade, DPM  Castellani Paint 1.5 % LIQD Apply 1 application topically daily. 10/09/17   Trula Slade, DPM  Diclofenac Sodium 3 % GEL Apply 2 g topically 3 (three) times daily as needed. 11/13/20   Quintella Reichert, MD  terbinafine (LAMISIL) 250 MG tablet Take 1 tablet (250 mg total) by mouth daily. 07/09/17   Trula Slade, DPM  terbinafine (LAMISIL) 250 MG tablet Take 1 tablet (250 mg total) by mouth daily. 10/13/17   Trula Slade, DPM  tolnaftate (TINACTIN) 1 % cream Apply 1 application topically 2 (two) times daily. Placed between toes after cleaning and drying feet well. 06/17/21   Charlesetta Shanks, MD  traMADol Veatrice Bourbon) 50 MG tablet 1-2 tablets every 6 hours as needed for pain Patient not taking: Reported on 07/03/2021 06/17/21   Charlesetta Shanks, MD    Family History History reviewed. No pertinent family history.  Social History Social History   Tobacco Use   Smoking status: Never   Smokeless tobacco: Never  Substance Use Topics   Alcohol use: Never   Drug use: Never     Allergies   Patient has no known  allergies.   Review of Systems Review of Systems  Constitutional:  Negative for chills and fever.  Eyes:  Negative for pain, discharge, redness and visual disturbance.  Skin:  Negative for color change and wound.  Neurological:  Negative for numbness.    Physical Exam Triage Vital Signs ED Triage Vitals  Enc Vitals Group     BP      Pulse      Resp      Temp      Temp src      SpO2      Weight      Height      Head Circumference      Peak Flow      Pain Score      Pain Loc      Pain Edu?      Excl. in [place]?    No data found.  Updated Vital Signs BP 131/86 (BP Location: Left Arm)    Pulse 81    Temp 98.1 F (36.7 C) (Oral)    Resp 18    SpO2 97%   Physical Exam Vitals and nursing note reviewed.  Constitutional:      General: He is not in acute distress.    Appearance: Normal appearance. He is not ill-appearing.  HENT:     Head: Normocephalic and atraumatic.  Eyes:     Extraocular Movements: Extraocular movements intact.     Conjunctiva/sclera: Conjunctivae normal.     Pupils: Pupils are equal, round, and reactive to light.  Cardiovascular:     Rate and Rhythm: Normal rate.  Pulmonary:     Effort: Pulmonary effort is normal.  Skin:    Comments: Small cyst like superficial lesion to face just lateral to left eye, no erythema, no TTP  Neurological:     Mental Status: He is alert.  Psychiatric:        Mood and Affect: Mood normal.        Behavior: Behavior normal.        Thought Content: Thought content normal.     UC Treatments / Results  Labs (all labs ordered are listed, but only abnormal results are displayed) Labs Reviewed - No data to display  EKG   Radiology No results found.  Procedures Procedures (including critical care time)  Medications Ordered in UC Medications - No data to display  Initial Impression / Assessment and Plan / UC Course  I have reviewed the triage vital signs and the nursing notes.  Pertinent labs & imaging results  that were available during my care of the patient were reviewed by me and considered in my medical decision making (see chart for details).    Low suspicion of infectious cause. Given location recommended further evaluation by ophthalmology for possible removal. Patient expresses understanding.  Final Clinical Impressions(s) / UC Diagnoses   Final diagnoses:  Face lesion     Discharge Instructions        Please contact ophthalmology for further evaluation of cyst.        ED Prescriptions   None    PDMP not reviewed this encounter.   Francene Finders, PA-C 07/03/21 1053

## 2021-07-03 NOTE — Discharge Instructions (Signed)
° °  Please contact ophthalmology for further evaluation of cyst.

## 2021-07-03 NOTE — ED Triage Notes (Signed)
Pt here for cyst beside left eye x 3 weeks; denies drainage or pain

## 2022-09-22 IMAGING — US US EXTREM LOW VENOUS*L*
1 series · 14 of 24 positions shown · non-contrast
Comparison: None.

CLINICAL DATA: 60-year-old male with left lower extremity cramping.

EXAM:
Left LOWER EXTREMITY VENOUS DOPPLER ULTRASOUND
TECHNIQUE: Gray-scale sonography with compression, as well as color and duplex
ultrasound, were performed to evaluate the deep venous system(s)
from the level of the common femoral vein through the popliteal and
proximal calf veins.

[Series 1: us venous img lower uni left (dvt) · portal-venous · 14 of 45 slices shown]
[im 1/45]
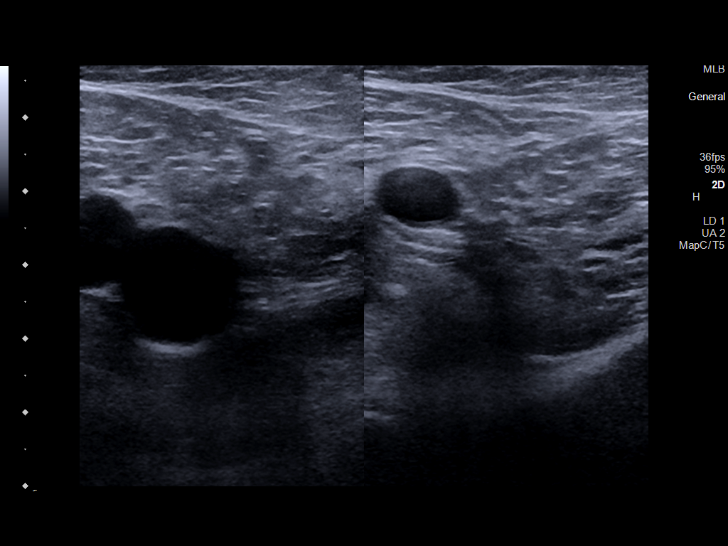
[im 4/45]
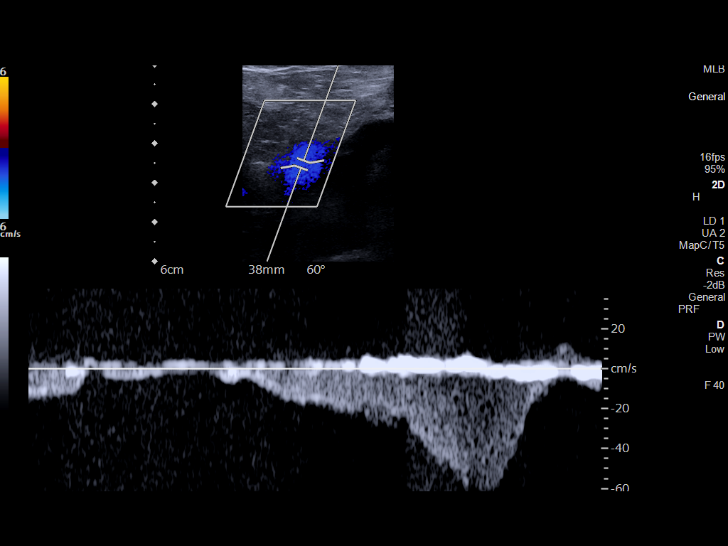
[im 8/45]
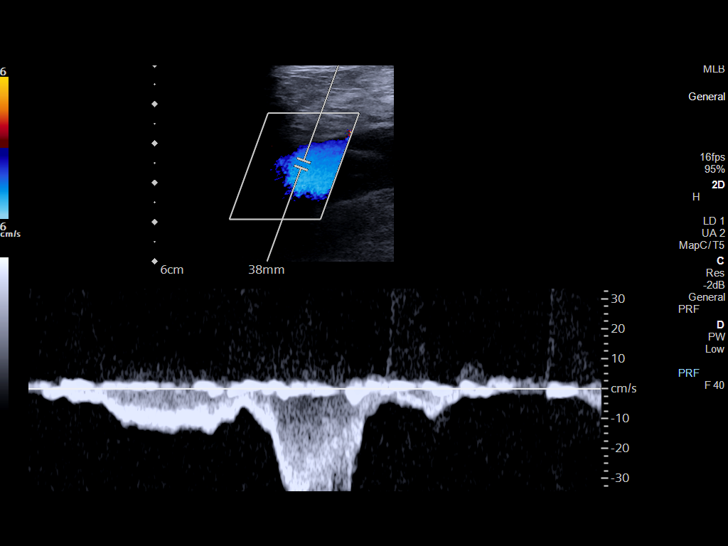
[im 12/45]
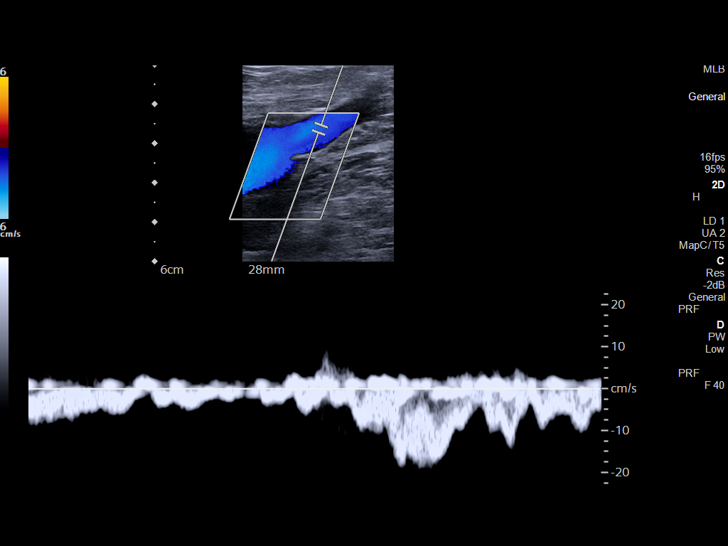
[im 14/45]
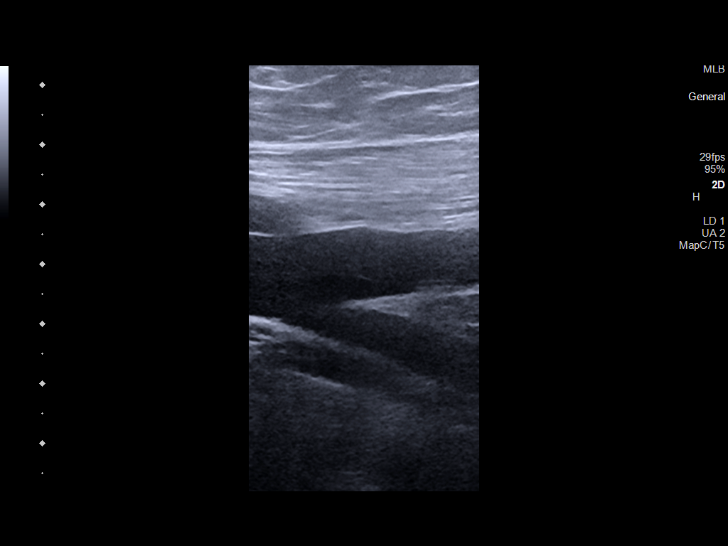
[im 18/45]
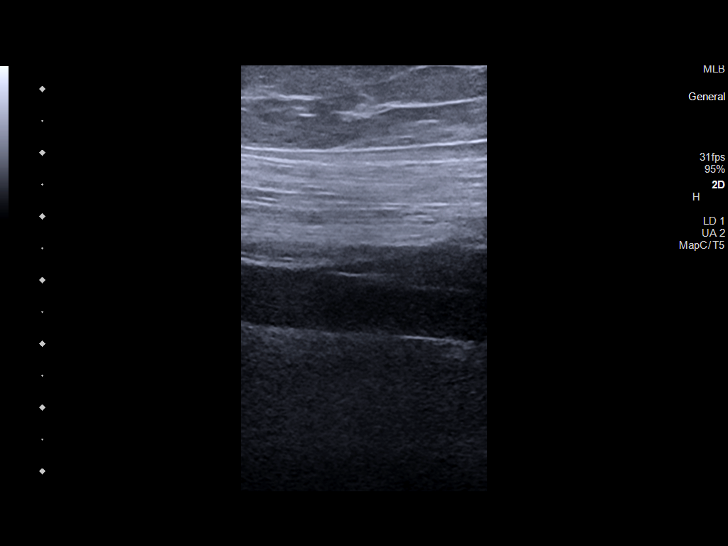
[im 22/45]
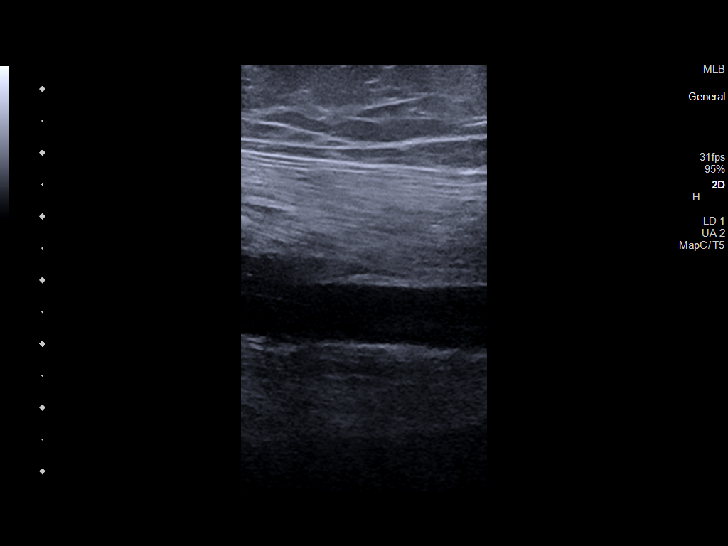
[im 23/45]
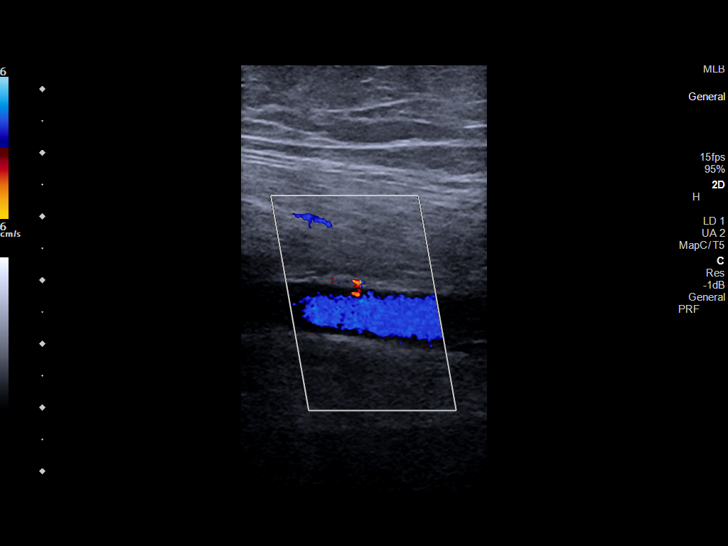
[im 27/45]
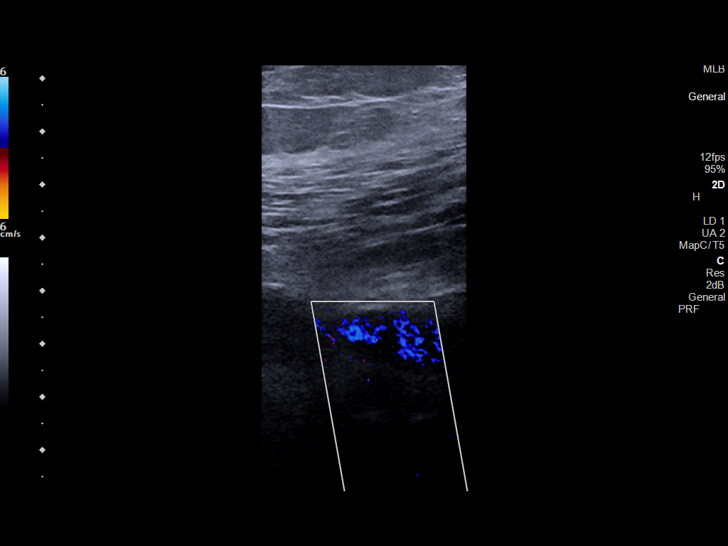
[im 31/45]
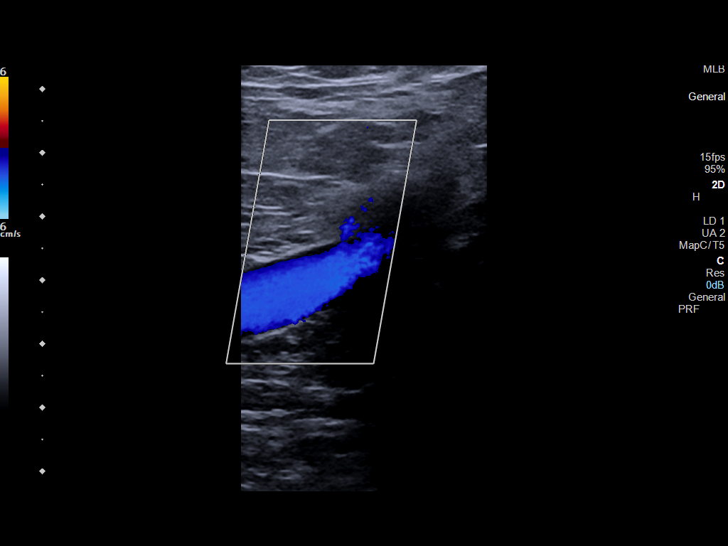
[im 35/45]
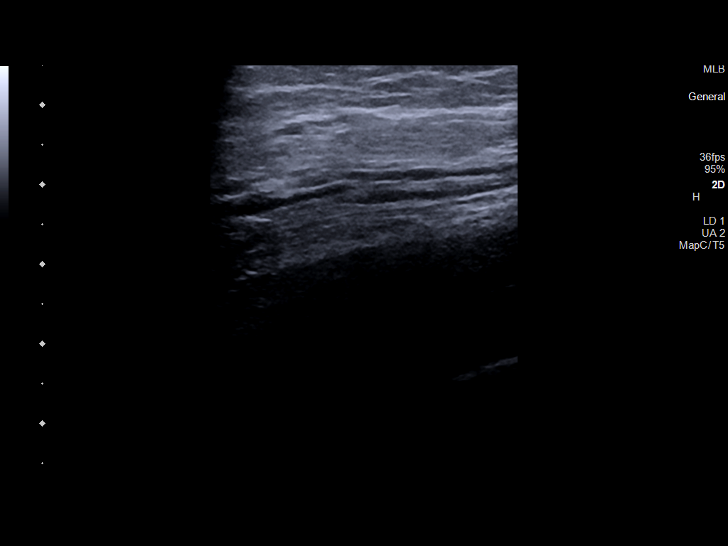
[im 37/45]
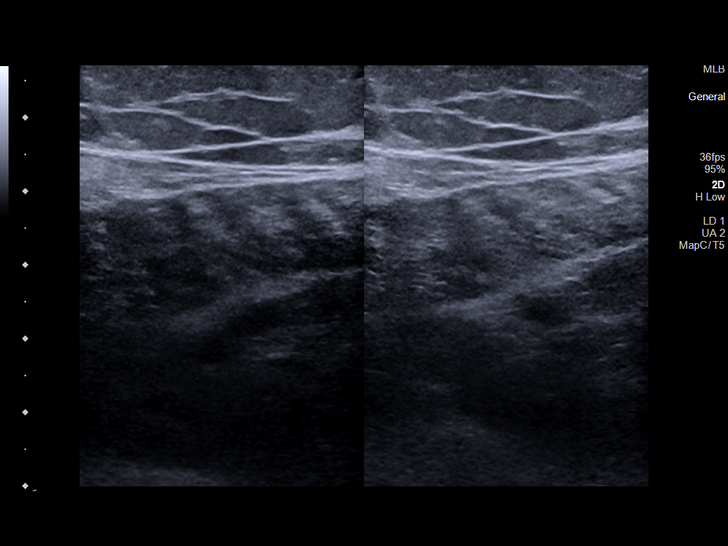
[im 41/45]
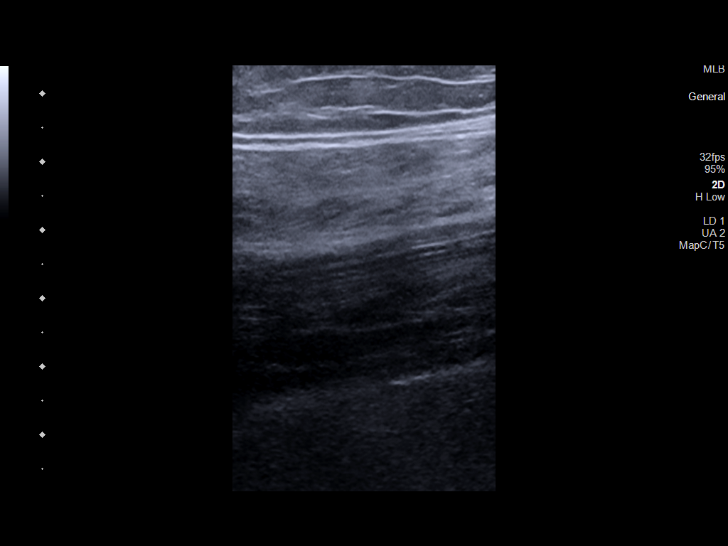
[im 45/45]
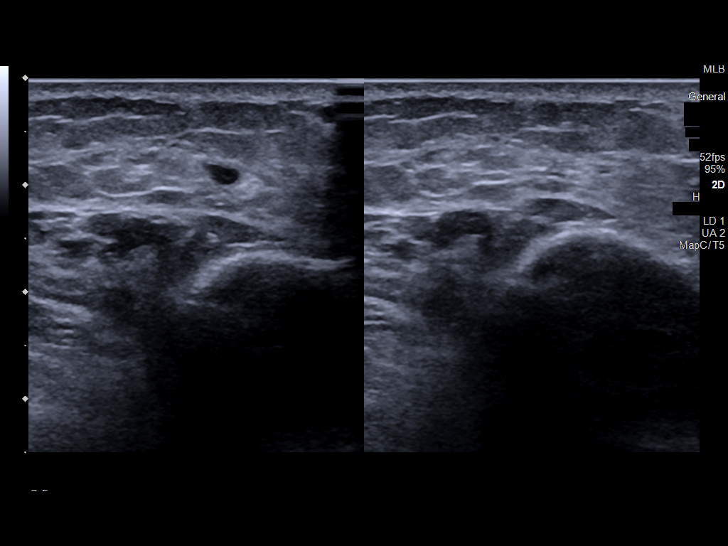

[14 of 24 positions shown; findings below may reference images not displayed]

FINDINGS: VENOUS

Normal compressibility of the common femoral, superficial femoral,
and popliteal veins, as well as the visualized calf veins.
Visualized portions of profunda femoral vein and great saphenous
vein unremarkable. No filling defects to suggest DVT on grayscale or
color Doppler imaging. Doppler waveforms show normal direction of
venous flow, normal respiratory plasticity and response to
augmentation.

Limited views of the contralateral common femoral vein are
unremarkable.

OTHER

None.

Limitations: none
IMPRESSION: Negative.
